# Patient Record
Sex: Male | Born: 1995 | Race: White | Hispanic: No | Marital: Single | Smoking: Never smoker
Health system: Southern US, Community
[De-identification: ages and names within clinical notes are randomized; demographics above are authoritative.]

## PROBLEM LIST (undated history)

## (undated) DIAGNOSIS — F32A Depression, unspecified: Secondary | ICD-10-CM

## (undated) DIAGNOSIS — F419 Anxiety disorder, unspecified: Secondary | ICD-10-CM

## (undated) HISTORY — PX: WISDOM TOOTH EXTRACTION: SHX21

---

## 2020-11-12 ENCOUNTER — Emergency Department (HOSPITAL_COMMUNITY): Payer: BC Managed Care – PPO

## 2020-11-12 ENCOUNTER — Other Ambulatory Visit: Payer: Self-pay

## 2020-11-12 ENCOUNTER — Encounter (HOSPITAL_COMMUNITY): Payer: Self-pay

## 2020-11-12 ENCOUNTER — Emergency Department (HOSPITAL_COMMUNITY)
Admission: EM | Admit: 2020-11-12 | Discharge: 2020-11-12 | Disposition: A | Discharge status: Home / Self Care | Payer: BC Managed Care – PPO | Attending: Emergency Medicine | Admitting: Emergency Medicine

## 2020-11-12 DIAGNOSIS — F419 Anxiety disorder, unspecified: Secondary | ICD-10-CM | POA: Insufficient documentation

## 2020-11-12 DIAGNOSIS — R1084 Generalized abdominal pain: Secondary | ICD-10-CM | POA: Insufficient documentation

## 2020-11-12 DIAGNOSIS — R110 Nausea: Secondary | ICD-10-CM | POA: Diagnosis not present

## 2020-11-12 DIAGNOSIS — Z7722 Contact with and (suspected) exposure to environmental tobacco smoke (acute) (chronic): Secondary | ICD-10-CM | POA: Diagnosis not present

## 2020-11-12 HISTORY — DX: Depression, unspecified: F32.A

## 2020-11-12 HISTORY — DX: Anxiety disorder, unspecified: F41.9

## 2020-11-12 LAB — CBC WITH DIFFERENTIAL/PLATELET
Abs Immature Granulocytes: 0.03 10*3/uL (ref 0.00–0.07)
Basophils Absolute: 0 10*3/uL (ref 0.0–0.1)
Basophils Relative: 1 %
Eosinophils Absolute: 0.1 10*3/uL (ref 0.0–0.5)
Eosinophils Relative: 1 %
HCT: 47 % (ref 39.0–52.0)
Hemoglobin: 16.5 g/dL (ref 13.0–17.0)
Immature Granulocytes: 1 %
Lymphocytes Relative: 25 %
Lymphs Abs: 1.4 10*3/uL (ref 0.7–4.0)
MCH: 30 pg (ref 26.0–34.0)
MCHC: 35.1 g/dL (ref 30.0–36.0)
MCV: 85.5 fL (ref 80.0–100.0)
Monocytes Absolute: 0.5 10*3/uL (ref 0.1–1.0)
Monocytes Relative: 9 %
Neutro Abs: 3.7 10*3/uL (ref 1.7–7.7)
Neutrophils Relative %: 63 %
Platelets: 184 10*3/uL (ref 150–400)
RBC: 5.5 MIL/uL (ref 4.22–5.81)
RDW: 12.4 % (ref 11.5–15.5)
WBC: 5.8 10*3/uL (ref 4.0–10.5)
nRBC: 0 % (ref 0.0–0.2)

## 2020-11-12 LAB — COMPREHENSIVE METABOLIC PANEL
ALT: 50 U/L — ABNORMAL HIGH (ref 0–44)
AST: 32 U/L (ref 15–41)
Albumin: 4.6 g/dL (ref 3.5–5.0)
Alkaline Phosphatase: 78 U/L (ref 38–126)
Anion gap: 7 (ref 5–15)
BUN: 12 mg/dL (ref 6–20)
CO2: 26 mmol/L (ref 22–32)
Calcium: 9.4 mg/dL (ref 8.9–10.3)
Chloride: 102 mmol/L (ref 98–111)
Creatinine, Ser: 0.96 mg/dL (ref 0.61–1.24)
GFR, Estimated: >60 mL/min (ref >60)
Glucose, Bld: 106 mg/dL — ABNORMAL HIGH (ref 70–99)
Potassium: 3.7 mmol/L (ref 3.5–5.1)
Sodium: 135 mmol/L (ref 135–145)
Total Bilirubin: 0.7 mg/dL (ref 0.3–1.2)
Total Protein: 8.1 g/dL (ref 6.5–8.1)

## 2020-11-12 LAB — URINALYSIS, ROUTINE W REFLEX MICROSCOPIC
Bilirubin Urine: NEGATIVE
Glucose, UA: NEGATIVE mg/dL
Hgb urine dipstick: NEGATIVE
Ketones, ur: NEGATIVE mg/dL
Leukocytes,Ua: NEGATIVE
Nitrite: NEGATIVE
Protein, ur: NEGATIVE mg/dL
Specific Gravity, Urine: 1.021 (ref 1.005–1.030)
pH: 8 (ref 5.0–8.0)

## 2020-11-12 LAB — LIPASE, BLOOD: Lipase: 32 U/L (ref 11–51)

## 2020-11-12 MED ORDER — ONDANSETRON 4 MG PO TBDP
4.0000 mg | ORAL_TABLET | Freq: Once | ORAL | Status: AC
  Administered 2020-11-12: 4 mg via ORAL
  Filled 2020-11-12: qty 1

## 2020-11-12 MED ORDER — ONDANSETRON 4 MG PO TBDP: 4.0000 mg | ORAL_TABLET | Freq: Three times a day (TID) | ORAL | 0 refills | Status: AC | PRN

## 2020-11-12 MED ORDER — OMEPRAZOLE 20 MG PO CPDR: 20.0000 mg | DELAYED_RELEASE_CAPSULE | Freq: Every day | ORAL | 0 refills | Status: AC

## 2020-11-12 NOTE — ED Provider Notes (Signed)
[facility] DEPT Provider Note   CSN: [id number] Arrival date & time: 11/12/20  1245     History Chief Complaint  Patient presents with   Nausea   Anxiety    Christopher Martin is a 25 y.o. male.  25 year old male with complaint of feeling unwell today. Reports feeling nauseous this morning upon waking, went out to eat with family and developed abdominal discomfort with tingling in his chest, abdomen, upper extremities. Patient tried eating lunch without improvement in symptoms. Reports feeling unwell with various body aches for the past few weeks, had an inspection of his apartment yesterday and told he likely has mold, unsure how this is impacting his health. Does not take medications regularly, no prior abdominal surgeries.       Past Medical History:  Diagnosis Date   Anxiety    Depression     There are no problems to display for this patient.   Past Surgical History:  Procedure Laterality Date   WISDOM TOOTH EXTRACTION         History reviewed. No pertinent family history.  Social History   Tobacco Use   Smoking status: Never    Passive exposure: Current   Smokeless tobacco: Never  Substance Use Topics   Alcohol use: Never   Drug use: Never    Home Medications Prior to Admission medications   Medication Sig Start Date End Date Taking? Authorizing Provider  calcium carbonate (TUMS - DOSED IN MG ELEMENTAL CALCIUM) 500 MG chewable tablet Chew 500 mg by mouth 3 (three) times daily as needed for indigestion or heartburn.   Yes [provider]  ibuprofen (ADVIL) 200 MG tablet Take 200-400 mg by mouth every 6 (six) hours as needed for headache, fever or mild pain.   Yes [provider]  omeprazole (PRILOSEC) 20 MG capsule Take 1 capsule (20 mg total) by mouth daily. 11/12/20  Yes Tacy Learn, PA-C  ondansetron (ZOFRAN ODT) 4 MG disintegrating tablet Take 1 tablet (4 mg total) by mouth every 8 (eight) hours as needed  for nausea or vomiting. 11/12/20  Yes Tacy Learn, PA-C    Allergies    Patient has no known allergies.  Review of Systems   Review of Systems  Constitutional:  Negative for fever.  Respiratory:  Negative for shortness of breath.   Cardiovascular:  Negative for chest pain.  Gastrointestinal:  Positive for abdominal pain. Negative for constipation, diarrhea and vomiting.  Genitourinary:  Negative for dysuria.  Musculoskeletal:  Negative for arthralgias and myalgias.  Skin:  Negative for rash and wound.  Allergic/Immunologic: Negative for immunocompromised state.  Neurological:  Negative for weakness.  Hematological:  Negative for adenopathy.  Psychiatric/Behavioral:  Negative for confusion.   All other systems reviewed and are negative.  Physical Exam Updated Vital Signs BP 138/78 (BP Location: Left Arm)   Pulse 90   Temp 98.5 F (36.9 C) (Oral)   Resp 16   SpO2 99%   Physical Exam Vitals and nursing note reviewed.  Constitutional:      General: He is not in acute distress.    Appearance: He is well-developed. He is not diaphoretic.  HENT:     Head: Normocephalic and atraumatic.  Cardiovascular:     Rate and Rhythm: Normal rate and regular rhythm.     Heart sounds: Normal heart sounds.  Pulmonary:     Effort: Pulmonary effort is normal.     Breath sounds: Normal breath sounds.  Abdominal:  Palpations: Abdomen is soft.     Tenderness: There is no abdominal tenderness.  Musculoskeletal:     Cervical back: Neck supple.  Skin:    General: Skin is warm and dry.     Findings: No erythema or rash.  Neurological:     Mental Status: He is alert and oriented to person, place, and time.  Psychiatric:        Behavior: Behavior normal.    ED Results / Procedures / Treatments   Labs (all labs ordered are listed, but only abnormal results are displayed) Labs Reviewed  COMPREHENSIVE METABOLIC PANEL - Abnormal; Notable for the following components:      Result Value    Glucose, Bld 106 (*)    ALT 50 (*)    All other components within normal limits  CBC WITH DIFFERENTIAL/PLATELET  LIPASE, BLOOD  URINALYSIS, ROUTINE W REFLEX MICROSCOPIC    EKG None  Radiology DG Chest 2 View  Result Date: 11/12/2020 CLINICAL DATA:  Mold exposure, lightheadedness EXAM: CHEST - 2 VIEW COMPARISON:  None. FINDINGS: The heart size and mediastinal contours are within normal limits. Both lungs are clear. The visualized skeletal structures are unremarkable. IMPRESSION: Normal chest radiographs. Electronically Signed   By: Davina Poke D.O.   On: 11/12/2020 14:18    Procedures Procedures   Medications Ordered in ED Medications  ondansetron (ZOFRAN-ODT) disintegrating tablet 4 mg (4 mg Oral Given 11/12/20 1332)    ED Course  I have reviewed the triage vital signs and the nursing notes.  Pertinent labs & imaging results that were available during my care of the patient were reviewed by me and considered in my medical decision making (see chart for details).  Clinical Course as of 11/12/20 1432  Sun Nov 12, 8640  [id number] 25 year old male with complaint of feeling well as above. Exam is reassuring, abdomen is soft and non tender, negative murphys sign. CBC WNL, CMP with mildly elevated ALT at 50, normal bili and alk phos, lipase WNL UA normal. CXR unremarkable. Feeling somewhat better after Zofran. Will give rx for zofran and omeprazole. Recommend recheck with PCP.  [LM]    Clinical Course User Index [LM] Roque Lias   MDM Rules/Calculators/A&P                           Final Clinical Impression(s) / ED Diagnoses Final diagnoses:  Nausea  Generalized abdominal pain    Rx / DC Orders ED Discharge Orders          Ordered    ondansetron (ZOFRAN ODT) 4 MG disintegrating tablet  Every 8 hours PRN        11/12/20 1427    omeprazole (PRILOSEC) 20 MG capsule  Daily        11/12/20 1427             Tacy Learn, PA-C 11/12/20 1432     [place], [place], DO 11/12/20 1441

## 2020-11-12 NOTE — Discharge Instructions (Addendum)
Take Zofran as needed as prescribed for nausea. Take Omeprazole daily and see if this helps with your indigestion symptoms. Recheck with your doctor as discussed.

## 2020-11-12 NOTE — ED Triage Notes (Addendum)
Pt arrived via walk in, states he felt very lightheaded at lunch and then started with tingling throughout entire body. States he has felt very nauseated x1 week, and body aches with very little appetite. Also endorses recent finding of mold in apartment he believes is the cause. Hx of anxiety, believes this is also contributing.

## 2022-06-08 IMAGING — CR DG CHEST 2V
2 series · 2 of 2 positions shown · non-contrast
Comparison: None.

CLINICAL DATA: Mold exposure, lightheadedness

EXAM:
CHEST - 2 VIEW

[w chest pa]
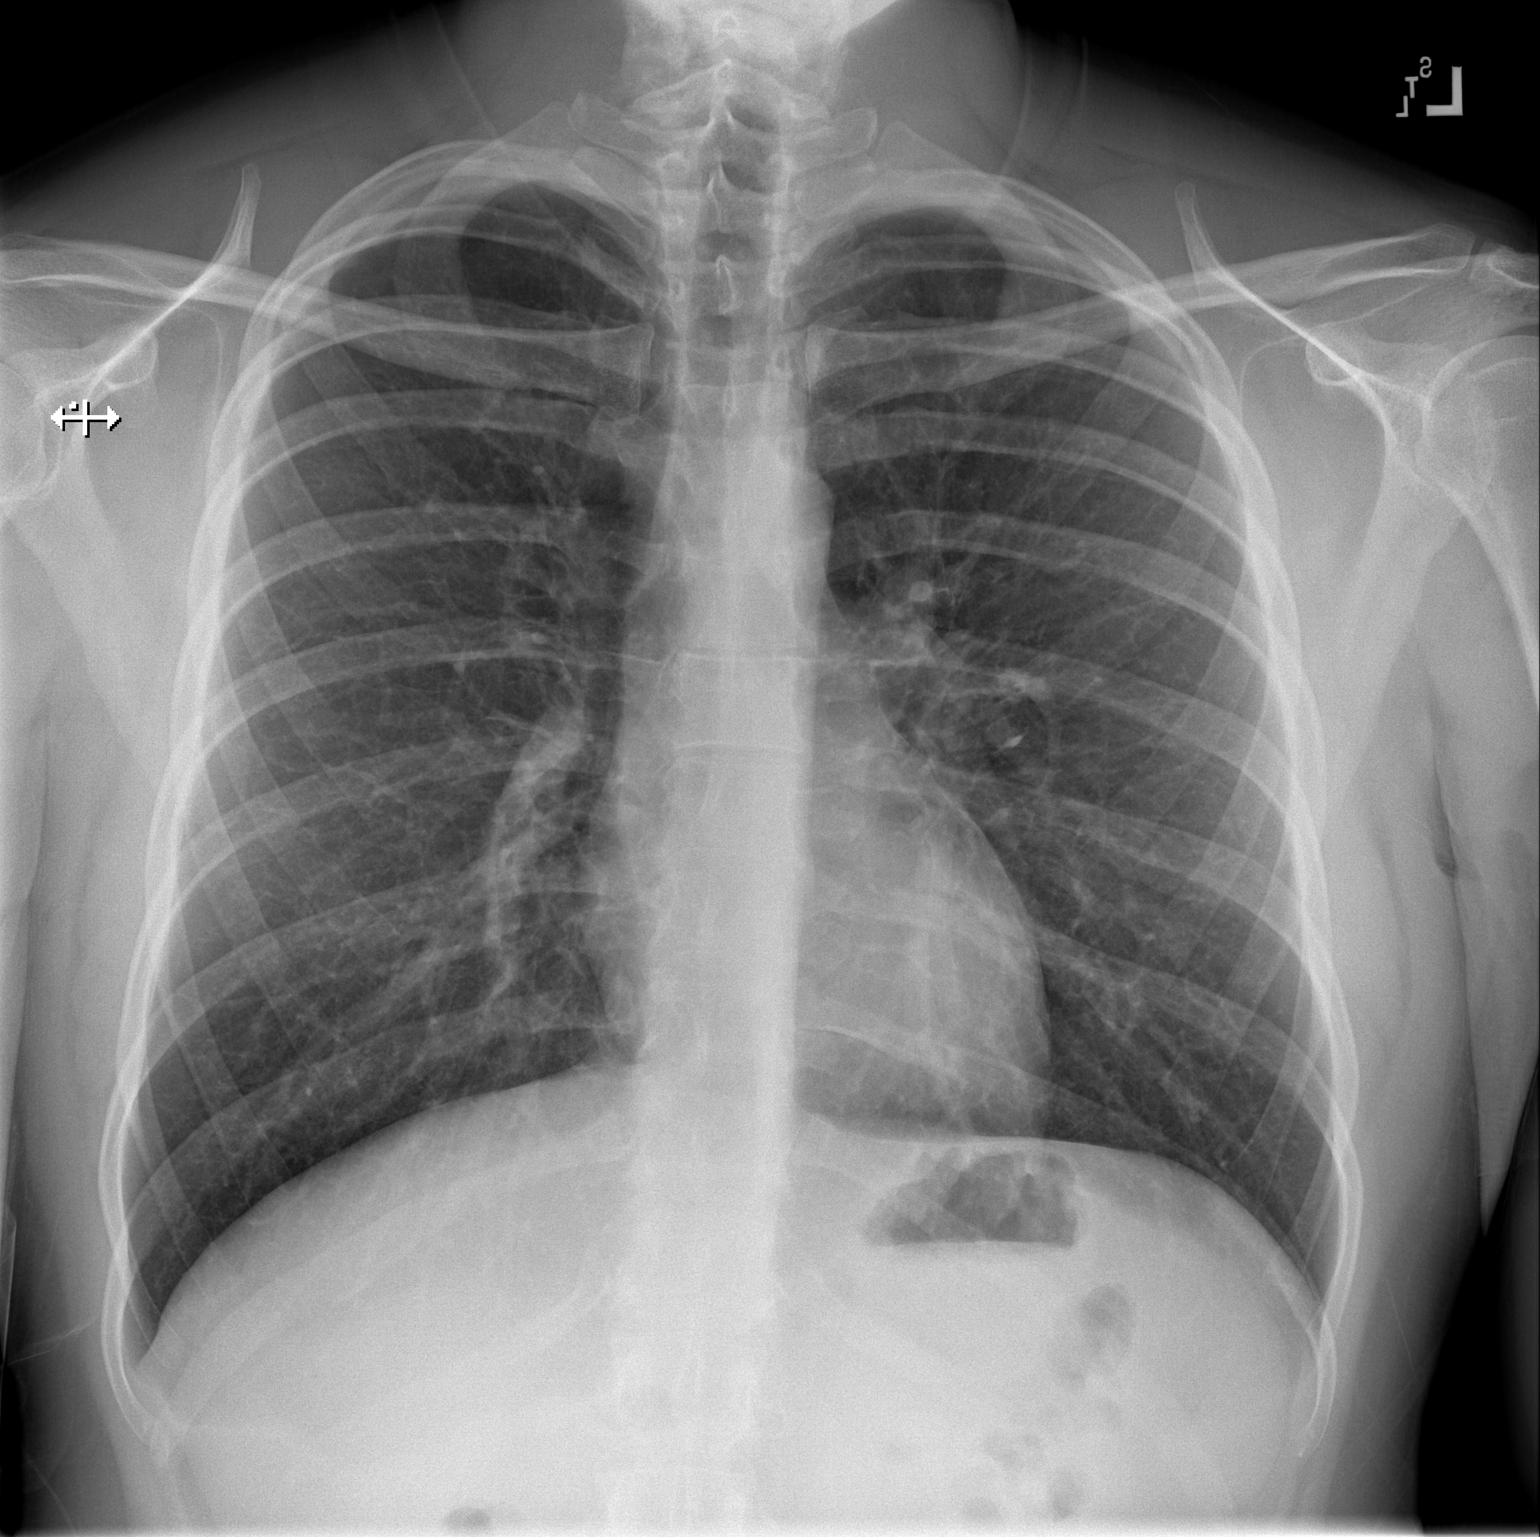

[w chest lat]
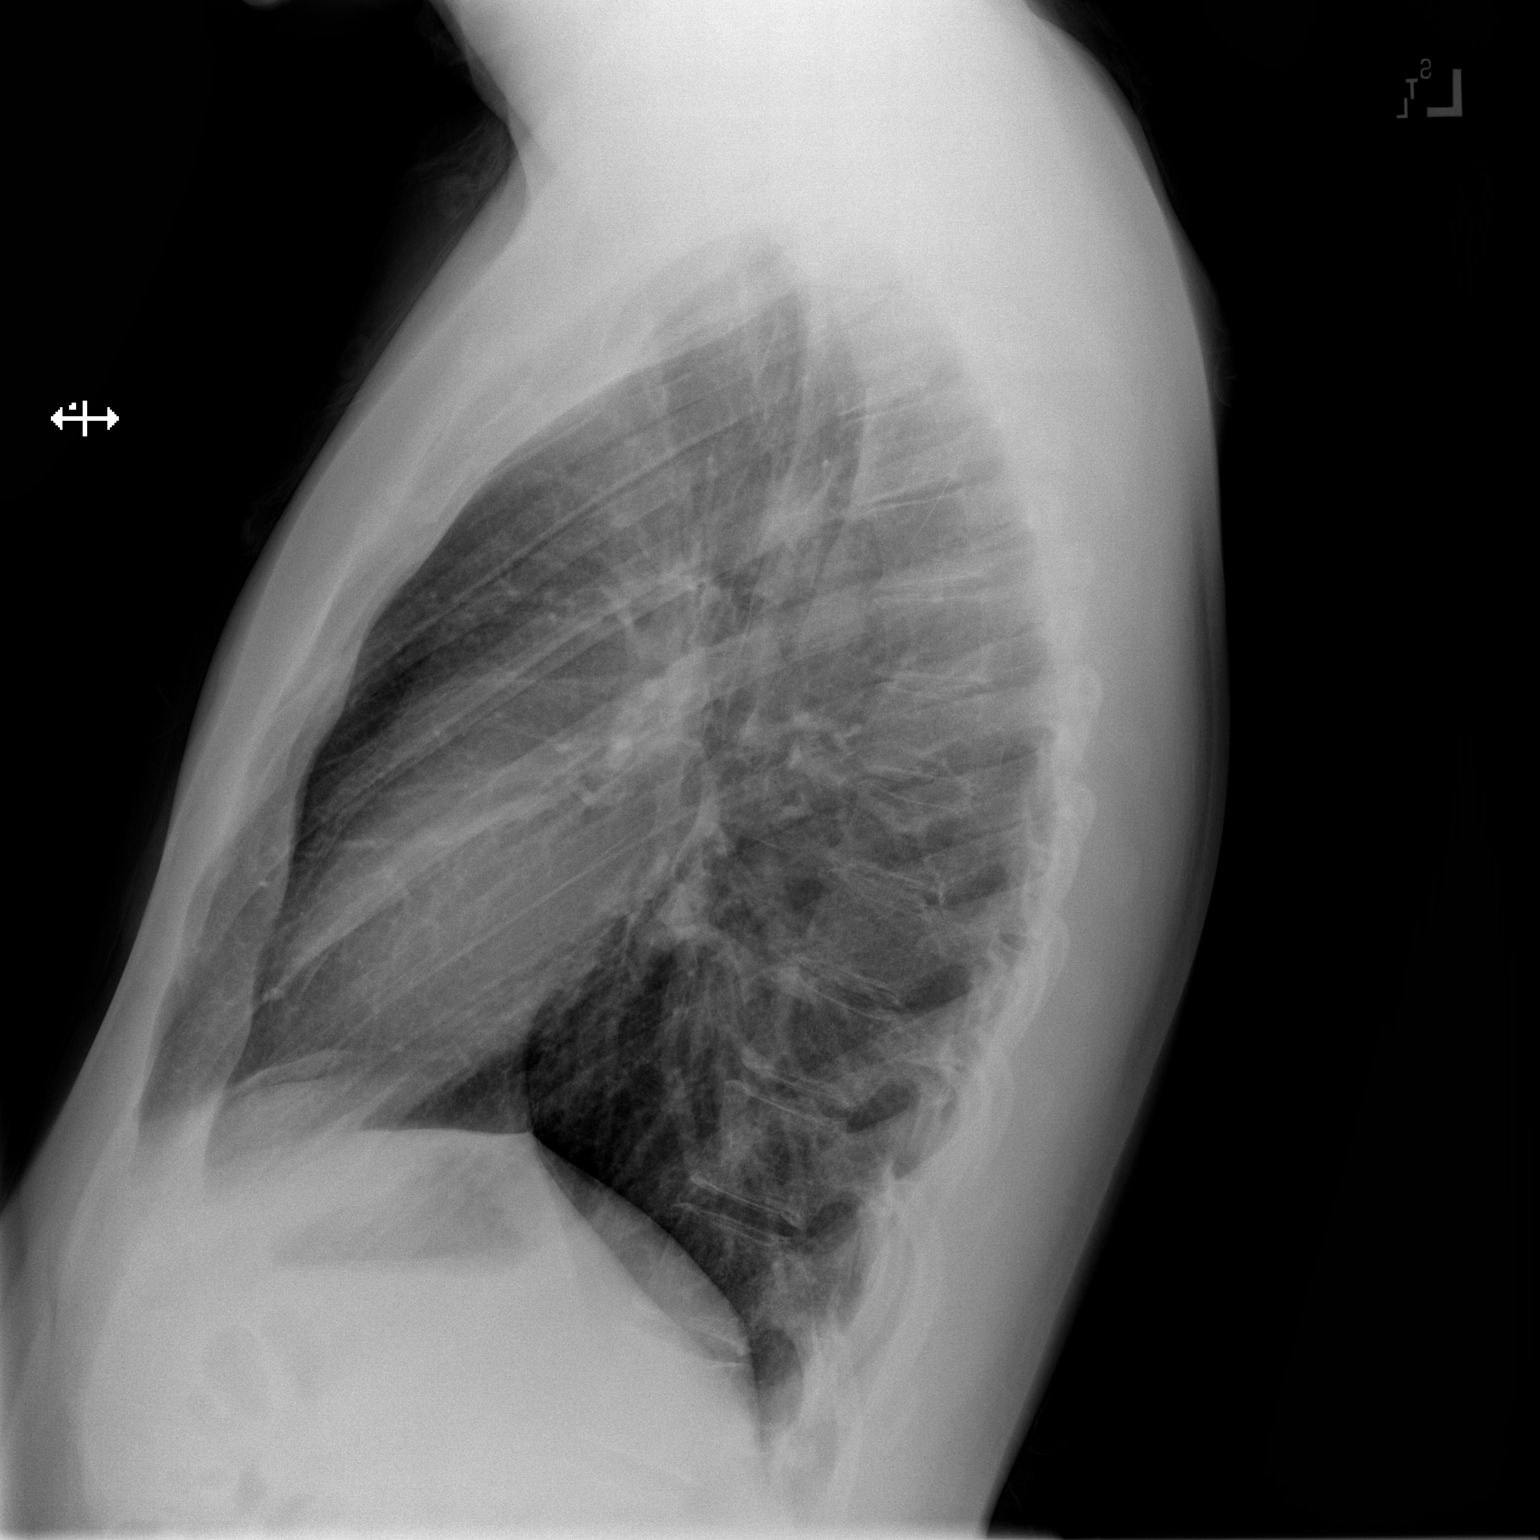

[2 of 2 positions shown; findings below may reference images not displayed]

FINDINGS: The heart size and mediastinal contours are within normal limits.
Both lungs are clear. The visualized skeletal structures are
unremarkable.
IMPRESSION: Normal chest radiographs.
